# Patient Record
Sex: Female | Born: 1965 | Race: Black or African American | Hispanic: No | Marital: Single | Smoking: Never smoker
Health system: Southern US, Community
[De-identification: ages and names within clinical notes are randomized; demographics above are authoritative.]

## PROBLEM LIST (undated history)

## (undated) DIAGNOSIS — J45909 Unspecified asthma, uncomplicated: Secondary | ICD-10-CM

---

## 2016-06-10 ENCOUNTER — Encounter: Payer: Self-pay | Admitting: Obstetrics and Gynecology

## 2016-07-29 ENCOUNTER — Emergency Department (HOSPITAL_COMMUNITY): Payer: 59

## 2016-07-29 ENCOUNTER — Inpatient Hospital Stay (HOSPITAL_COMMUNITY)
Admit: 2016-07-29 | Discharge: 2016-07-29 | Disposition: A | Discharge status: Home / Self Care | Payer: 59 | Attending: Emergency Medicine | Admitting: Emergency Medicine

## 2016-07-29 ENCOUNTER — Emergency Department (HOSPITAL_COMMUNITY)
Admission: EM | Admit: 2016-07-29 | Discharge: 2016-07-29 | Disposition: A | Discharge status: Home / Self Care | Payer: 59 | Attending: Emergency Medicine | Admitting: Emergency Medicine

## 2016-07-29 ENCOUNTER — Encounter (HOSPITAL_COMMUNITY): Payer: Self-pay | Admitting: Emergency Medicine

## 2016-07-29 DIAGNOSIS — G932 Benign intracranial hypertension: Secondary | ICD-10-CM | POA: Insufficient documentation

## 2016-07-29 DIAGNOSIS — R51 Headache: Secondary | ICD-10-CM

## 2016-07-29 DIAGNOSIS — R519 Headache, unspecified: Secondary | ICD-10-CM

## 2016-07-29 DIAGNOSIS — R900 Intracranial space-occupying lesion found on diagnostic imaging of central nervous system: Secondary | ICD-10-CM

## 2016-07-29 DIAGNOSIS — R9089 Other abnormal findings on diagnostic imaging of central nervous system: Secondary | ICD-10-CM

## 2016-07-29 HISTORY — DX: Unspecified asthma, uncomplicated: J45.909

## 2016-07-29 LAB — I-STAT CHEM 8, ED
BUN: 11 mg/dL (ref 6–20)
CALCIUM ION: 1.17 mmol/L (ref 1.13–1.30)
CHLORIDE: 104 mmol/L (ref 101–111)
Creatinine, Ser: 0.9 mg/dL (ref 0.44–1.00)
Glucose, Bld: 105 mg/dL — ABNORMAL HIGH (ref 65–99)
HCT: 35 % — ABNORMAL LOW (ref 36.0–46.0)
Hemoglobin: 11.9 g/dL — ABNORMAL LOW (ref 12.0–15.0)
POTASSIUM: 4.2 mmol/L (ref 3.5–5.1)
SODIUM: 141 mmol/L (ref 135–145)
TCO2: 28 mmol/L (ref 0–100)

## 2016-07-29 LAB — I-STAT BETA HCG BLOOD, ED (MC, WL, AP ONLY): I-stat hCG, quantitative: <5 m[IU]/mL (ref <5)

## 2016-07-29 MED ORDER — HYDROCODONE-ACETAMINOPHEN 5-325 MG PO TABS
1.0000 | ORAL_TABLET | Freq: Once | ORAL | Status: AC
  Administered 2016-07-29: 1 via ORAL
  Filled 2016-07-29: qty 1

## 2016-07-29 MED ORDER — METOCLOPRAMIDE HCL 5 MG/ML IJ SOLN
10.0000 mg | Freq: Once | INTRAMUSCULAR | Status: AC
  Administered 2016-07-29: 10 mg via INTRAVENOUS
  Filled 2016-07-29: qty 2

## 2016-07-29 MED ORDER — SODIUM CHLORIDE 0.9 % IV BOLUS (SEPSIS)
1000.0000 mL | Freq: Once | INTRAVENOUS | Status: AC
  Administered 2016-07-29: 1000 mL via INTRAVENOUS

## 2016-07-29 MED ORDER — ACETAZOLAMIDE 250 MG PO TABS: 250.0000 mg | ORAL_TABLET | Freq: Three times a day (TID) | ORAL | 1 refills | Status: DC

## 2016-07-29 MED ORDER — DIPHENHYDRAMINE HCL 50 MG/ML IJ SOLN
25.0000 mg | Freq: Once | INTRAMUSCULAR | Status: AC
  Administered 2016-07-29: 25 mg via INTRAVENOUS
  Filled 2016-07-29: qty 1

## 2016-07-29 MED ORDER — HYDROCODONE-ACETAMINOPHEN 5-325 MG PO TABS: 2.0000 | ORAL_TABLET | ORAL | 0 refills | Status: DC | PRN

## 2016-07-29 MED ORDER — ACETAZOLAMIDE 250 MG PO TABS
250.0000 mg | ORAL_TABLET | Freq: Once | ORAL | Status: AC
  Administered 2016-07-29: 250 mg via ORAL
  Filled 2016-07-29: qty 1

## 2016-07-29 MED ORDER — ONDANSETRON 4 MG PO TBDP
4.0000 mg | ORAL_TABLET | Freq: Once | ORAL | Status: AC
  Administered 2016-07-29: 4 mg via ORAL
  Filled 2016-07-29: qty 1

## 2016-07-29 MED ORDER — GADOBENATE DIMEGLUMINE 529 MG/ML IV SOLN
20.0000 mL | Freq: Once | INTRAVENOUS | Status: AC
  Administered 2016-07-29: 20 mL via INTRAVENOUS

## 2016-07-29 MED ORDER — LORAZEPAM 2 MG/ML IJ SOLN
1.0000 mg | Freq: Once | INTRAMUSCULAR | Status: AC
  Administered 2016-07-29: 1 mg via INTRAVENOUS
  Filled 2016-07-29: qty 1

## 2016-07-29 NOTE — ED Notes (Signed)
Patient transported to CT 

## 2016-07-29 NOTE — ED Provider Notes (Signed)
Patient is a 50 year old female with a history of pseudotumor cerebri in the past to had been on Diamox but has not been on meds for some time presents with 3 weeks of persistent headache that's worsening and now causing dizziness upon standing. Patient was seen at [facility] and sent here for an MRI because of an abnormal CT. Patient received a headache cocktail with improvement in headache but is still not gone. Patient is not requesting anything at this time.   Gwyneth Sprout, MD 07/29/16 1409

## 2016-07-29 NOTE — ED Notes (Signed)
Re;port given to Paula/[facility]

## 2016-07-29 NOTE — ED Notes (Signed)
[facility] called for transportation 

## 2016-07-29 NOTE — ED Provider Notes (Signed)
[facility]-EMERGENCY DEPT Provider Note   CSN: [id number] Arrival date & time: 07/29/16  5974  First Provider Contact:  First MD Initiated Contact with Patient 07/29/16 0740        History   Chief Complaint Chief Complaint  Patient presents with  . Migraine  . Dizziness    HPI Emilyelizabeth Battistone is a 50 y.o. female.  HPI 50 year old African-American female with past medical history of obesity, sleep apnea, and previous history of pseudotumor who presents with a one-month history of progressively worsening daily headaches. The patient states for the last month, she has had progressively worsening, primarily frontal headaches. She describes a headache as aching, throbbing headaches. They are typically worse in the morning, but persisted throughout the day. She has taken over-the-counter antihistamines, analgesics, with no improvement. She has been wearing her CPAP. She states her pain and symptoms are very different from her sleep apnea headaches as well as her previous pseudotumor. Regarding her pseudotumor, she states that she was previously on Diamox but is off this is her symptoms had resolved. Denies any recent weight gain. Denies any vision changes. She had some mild, transient dizziness yesterday upon standing at work and subsequent decided to present for evaluation. Denies any vertigo symptoms. No fevers or chills.  Past Medical History:  Diagnosis Date  . Asthma     There are no active problems to display for this patient.   History reviewed. No pertinent surgical history.  OB History    No data available       Home Medications    Prior to Admission medications   Not on File    Family History No family history on file.  Social History Social History  Substance Use Topics  . Smoking status: Never Smoker  . Smokeless tobacco: Never Used  . Alcohol use No     Allergies   Review of patient's allergies indicates not on file.   Review of Systems Review of  Systems  Constitutional: Negative for chills, fatigue and fever.  HENT: Negative for congestion and rhinorrhea.   Eyes: Negative for visual disturbance.  Respiratory: Negative for cough, shortness of breath and wheezing.   Cardiovascular: Negative for chest pain and leg swelling.  Gastrointestinal: Negative for abdominal pain, diarrhea, nausea and vomiting.  Genitourinary: Negative for dysuria and flank pain.  Musculoskeletal: Negative for neck pain and neck stiffness.  Skin: Negative for rash and wound.  Allergic/Immunologic: Negative for immunocompromised state.  Neurological: Positive for light-headedness and headaches. Negative for syncope and weakness.     Physical Exam Updated Vital Signs BP (!) 157/108 (BP Location: Left Arm)   Pulse 72   Temp 98.5 F (36.9 C) (Oral)   Resp 20   SpO2 100%   Physical Exam  Constitutional: She is oriented to person, place, and time. She appears well-developed and well-nourished. No distress.  HENT:  Head: Normocephalic and atraumatic.  Mouth/Throat: Oropharynx is clear and moist.  No sinus tenderness to palpation or percussion.  Eyes: Conjunctivae are normal. Pupils are equal, round, and reactive to light.  Neck: Neck supple.  No neck stiffness or meningismus.  Cardiovascular: Normal rate, regular rhythm and normal heart sounds.  Exam reveals no friction rub.   No murmur heard. Pulmonary/Chest: Effort normal and breath sounds normal. No respiratory distress. She has no wheezes. She has no rales.  Abdominal: She exhibits no distension. There is no tenderness.  Musculoskeletal: She exhibits no edema.  Neurological: She is alert and oriented to person, place, and  time. She exhibits normal muscle tone.  Skin: Skin is warm. Capillary refill takes less than 2 seconds.  Nursing note and vitals reviewed.  Neurological Exam:  Mental Status: Alert and oriented to person, place, and time. Attention and concentration normal. Speech clear. Recent  memory is intact. Cranial Nerves: Visual fields intact to confrontation in all quadrants bilaterally. EOMI and PERRLA. No nystagmus noted. Facial sensation intact at forehead, maxillary cheek, and chin/mandible bilaterally. No weakness of masticatory muscles. No facial asymmetry or weakness. Hearing grossly normal to finer rub. Uvula is midline, and palate elevates symmetrically. Normal SCM and trapezius strength. Tongue midline without fasciculations Motor: Muscle strength 5/5 in proximal and distal UE and LE bilaterally. No pronator drift. Muscle tone normal. Reflexes: 2+ and symmetrical in all four extremities.  Sensation: Intact to light touch in upper and lower extremities distally bilaterally.  Gait: Normal without ataxia. Coordination: Normal FTN bilaterally.   ED Treatments / Results  Labs (all labs ordered are listed, but only abnormal results are displayed) Labs Reviewed  I-STAT BETA HCG BLOOD, ED ([facility], [facility], [facility] ONLY)    EKG  EKG Interpretation None       Radiology No results found.  Procedures Procedures (including critical care time)  Medications Ordered in ED Medications  sodium chloride 0.9 % bolus 1,000 mL (not administered)  metoCLOPramide (REGLAN) injection 10 mg (not administered)  diphenhydrAMINE (BENADRYL) injection 25 mg (not administered)     Initial Impression / Assessment and Plan / ED Course  I have reviewed the triage vital signs and the nursing notes.  Pertinent labs & imaging results that were available during my care of the patient were reviewed by me and considered in my medical decision making (see chart for details).  Clinical Course  Value Comment By Time   50 year old African-American female with past medical history of hypertension, obesity, sleep apnea, and migraines as well as pseudotumor who presents with a one-month history of progressively worsening, dull, frontal headaches. She states her headaches are slightly different in character  from her previous headaches. Denies any recent head trauma. No focal numbness or weakness. She is neurologically intact on exam. She is mildly hypertensive but otherwise well-appearing. Of note, she has been taking over-the-counter analgesics regularly. Primary suspicion at this time is primary headache syndrome, likely tension type versus analgesic rebound. Migraine is also in the differential. Worsening sleep apnea is considered as well although she has been wearing her CPAP. No fever, neck stiffness, photophobia, or signs of meningismus or encephalitis. Of note, headache does seem to worsen in the morning. Given different quality and worsening in the mornings, with persistence despite outpatient management, will obtain CT scan to evaluate for intracranial mass. No focal neuro deficits on exam. Otherwise, will treat symptomatically and reassess. Shaune Pollack, MD 08/02 [phone]   CT head completed and reviewed by myself. There is an approximately 2 x 2 x 2 cm fatty mass over the sella. Patient denies any known history of dermoid cyst. Per radiology recommendation, will obtain MRI for further assessment. Shaune Pollack, MD 08/02 [phone]   Ativan ordered for claustrophobia with MRI. Shaune Pollack, MD 08/02 [phone]  MR ATTEMPTED STUDY-NO REPORT (Reviewed) Shaune Pollack, MD 08/02 1123   Unfortunately, patient unable to obtain MRI due to body size. Per discussion with radiology, [facility]. I discussed this with the patient and family in detail. Given her persistent headache and findings on CT, will transfer for MRI with and without contrast.  Sign out given to Dr. Lynelle Doctor, MD at [facility]. Patient reexamined and stable prior to transfer. Shaune Pollack, MD 08/02 1220     Final Clinical Impressions(s) / ED Diagnoses   Final diagnoses:  Headache  Acute nonintractable headache, unspecified headache type  Intracranial space-occupying lesion    New Prescriptions New  Prescriptions   No medications on file     Shaune Pollack, MD 07/29/16 1245

## 2016-07-29 NOTE — ED Notes (Signed)
Patient transported to MRI 

## 2016-07-29 NOTE — ED Triage Notes (Signed)
Patient here with complaints of headaches for a couple of weeks, and dizziness that started yesterday. Tylenol, Excedrin, allergy medication without relief.

## 2016-07-29 NOTE — ED Notes (Signed)
Placed patient in a gown and on the monitor 

## 2016-07-29 NOTE — ED Notes (Signed)
Explained transfer process to patient and family-patient is in agreement to be tranferred via [facility]

## 2016-07-29 NOTE — ED Provider Notes (Signed)
Patient seen and evaluated. Chart reviewed. Patient's care assumed. MRI findings discussed with patient. Findings show M.D. cella consistent with patient's past history of pseudotumor. Symptoms are fairly well-controlled at this time. We'll give neurology for follow-up. Prescriptions for Diamox. Vicodin for pain.   Rolland Porter, MD 07/29/16 [phone]

## 2016-07-29 NOTE — ED Notes (Signed)
EDP notified that the patient was too large to fit the MRI-MRI not completed.

## 2016-08-19 ENCOUNTER — Ambulatory Visit (INDEPENDENT_AMBULATORY_CARE_PROVIDER_SITE_OTHER): Payer: 59 | Admitting: Neurology

## 2016-08-19 ENCOUNTER — Encounter: Payer: Self-pay | Admitting: Neurology

## 2016-08-19 VITALS — BP 130/76 | HR 78 | Ht 66.0 in | Wt 298.0 lb

## 2016-08-19 DIAGNOSIS — G932 Benign intracranial hypertension: Secondary | ICD-10-CM | POA: Diagnosis not present

## 2016-08-19 MED ORDER — ACETAZOLAMIDE 250 MG PO TABS: 500.0000 mg | ORAL_TABLET | Freq: Two times a day (BID) | ORAL | 3 refills | Status: AC

## 2016-08-19 NOTE — Patient Instructions (Signed)
1.  I am going to increase the acetazolamide (Diamox) to 500mg  twice daily. 2.  Follow up with the ophthalmologist and follow up with me in 2 months 3.  Try to work on losing weight, which can help resolve this.

## 2016-08-19 NOTE — Progress Notes (Signed)
NEUROLOGY CONSULTATION NOTE  Sonya Wyatt MRN: [id number] DOB: [birth year]  Referring provider: ED referral Primary care provider: no PCP  Reason for consult:  headache  HISTORY OF PRESENT ILLNESS: Sonya Wyatt is a 50 year old left-handed woman with hypertension, morbid obesity, OSA, migraines and history of pseudotumor cerebri who presents for headache.  She was diagnosed with idiopathic intracranial hypertension in 2006, after developing headache and unsteady gait.  She was diagnosed via lumbar puncture and was on Diamox for a while.  It was eventually discontinued by her neurologist at the time.  Headaches were controlled until recently.  She developed the same type of headache.  It is bi-frontal/temporal and top of her head, 5-10/10 and non-throbbing.  It is not positional.  She reports blurred vision but denies visual obscurations.  She does report a pulsating sound in her head.  She presented to the ED on 07/29/16 for further evaluation.  CT head was personally reviewed and revealed a 1.9 x 2.3 x 2.3 cm fat-density mass over the sella.  MRI of brain with and without contrast was personally reviewed and revaled markedly enlarged empty sessa with dilated optic nerve sheathes, compatible with history of pseudotumor cerebri.  She was started on Diamox 250mg  three times daily, which helped the headaches but they have since returned over the past week.    Labs from 07/29/16 showed Na 141, K 4.2, Cl 104, BUN 11 and Cr 0.90.  PAST MEDICAL HISTORY: Past Medical History:  Diagnosis Date  . Asthma     PAST SURGICAL HISTORY: No past surgical history on file.  MEDICATIONS: Current Outpatient Prescriptions on File Prior to Visit  Medication Sig Dispense Refill  . albuterol (PROVENTIL HFA;VENTOLIN HFA) 108 (90 Base) MCG/ACT inhaler Inhale 2 puffs into the lungs every 6 (six) hours as needed for wheezing or shortness of breath.     No current facility-administered medications on file  prior to visit.     ALLERGIES: Allergies  Allergen Reactions  . Morphine And Related Shortness Of Breath and Other (See Comments)    Chest closes up    FAMILY HISTORY: No family history on file.  SOCIAL HISTORY: Social History   Social History  . Marital status: Single    Spouse name: N/A  . Number of children: N/A  . Years of education: N/A   Occupational History  . Not on file.   Social History Main Topics  . Smoking status: Never Smoker  . Smokeless tobacco: Never Used  . Alcohol use No  . Drug use: Unknown  . Sexual activity: Not on file   Other Topics Concern  . Not on file   Social History Narrative  . No narrative on file    REVIEW OF SYSTEMS: Constitutional: No fevers, chills, or sweats, no generalized fatigue, change in appetite Eyes: No visual changes, double vision, eye pain Ear, nose and throat: No hearing loss, ear pain, nasal congestion, sore throat Cardiovascular: No chest pain, palpitations Respiratory:  No shortness of breath at rest or with exertion, wheezes GastrointestinaI: No nausea, vomiting, diarrhea, abdominal pain, fecal incontinence Genitourinary:  No dysuria, urinary retention or frequency Musculoskeletal:  No neck pain, Wyatt pain Integumentary: No rash, pruritus, skin lesions Neurological: as above Psychiatric: No depression, insomnia, anxiety Endocrine: No palpitations, fatigue, diaphoresis, mood swings, change in appetite, change in weight, increased thirst Hematologic/Lymphatic:  No purpura, petechiae. Allergic/Immunologic: no itchy/runny eyes, nasal congestion, recent allergic reactions, rashes  PHYSICAL EXAM: Vitals:   08/19/16 1449  BP: 130/76  Pulse: 78   General: No acute distress.  Patient appears well-groomed.  Head:  Normocephalic/atraumatic Eyes:  fundi examined but not visualized Neck: supple, no paraspinal tenderness, full range of motion Wyatt: No paraspinal tenderness Heart: regular rate and rhythm Lungs:  Clear to auscultation bilaterally. Vascular: No carotid bruits. Neurological Exam: Mental status: alert and oriented to person, place, and time, recent and remote memory intact, fund of knowledge intact, attention and concentration intact, speech fluent and not dysarthric, language intact. Cranial nerves: CN I: not tested CN II: pupils equal, round and reactive to light, visual fields intact CN III, IV, VI:  full range of motion, no nystagmus, no ptosis CN V: facial sensation intact CN VII: upper and lower face symmetric CN VIII: hearing intact CN IX, X: gag intact, uvula midline CN XI: sternocleidomastoid and trapezius muscles intact CN XII: tongue midline Bulk & Tone: normal, no fasciculations. Motor:  5/5 throughout  Sensation: temperature and vibration sensation intact. Deep Tendon Reflexes:  2+ throughout, toes downgoing.  Finger to nose testing:  Without dysmetria.  Heel to shin:  Without dysmetria.  Gait:  Normal station and stride.  Able to turn and tandem walk. Romberg negative.  IMPRESSION: Headache, probable idiopathic intracranial hypertension  PLAN: 1.  Increase Diamox to 500mg  twice daily 2.  Refer to ophthalmology for formal eye exam 3.  Discussed weight loss 4.  Follow up in 2 months.   Shon MilletAdam Micholas Drumwright, DO

## 2016-08-20 NOTE — Addendum Note (Signed)
Addended by: Sheilah MinsFOX, JADA A on: 08/20/2016 08:18 AM   Modules accepted: Orders

## 2016-10-21 ENCOUNTER — Ambulatory Visit: Payer: 59 | Admitting: Neurology

## 2017-01-08 ENCOUNTER — Other Ambulatory Visit: Payer: Self-pay | Admitting: Family Medicine

## 2017-01-08 DIAGNOSIS — Z1231 Encounter for screening mammogram for malignant neoplasm of breast: Secondary | ICD-10-CM

## 2017-03-12 ENCOUNTER — Ambulatory Visit: Payer: 59

## 2017-09-27 IMAGING — CT CT HEAD W/O CM
3 of 4 series · 14 of 47 positions shown, 16 images · non-contrast
Comparison: None.

CLINICAL DATA: Headache for a couple of weeks with dizziness
beginning yesterday.

EXAM:
CT HEAD WITHOUT CONTRAST
TECHNIQUE: Contiguous axial images were obtained from the base of the skull
through the vertex without intravenous contrast.

[Series 2: head w/o · axial · non-contrast · 0.45mm/px · z∈[-166,-46]mm · 8 of 29 slices shown, 10 images]
[im 3/29  brain]
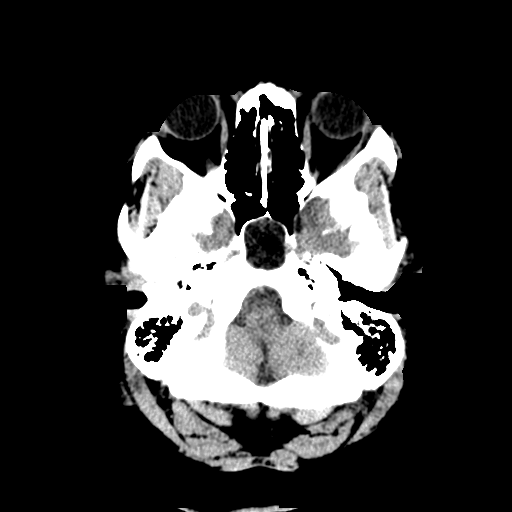
[im 3/29  bone]
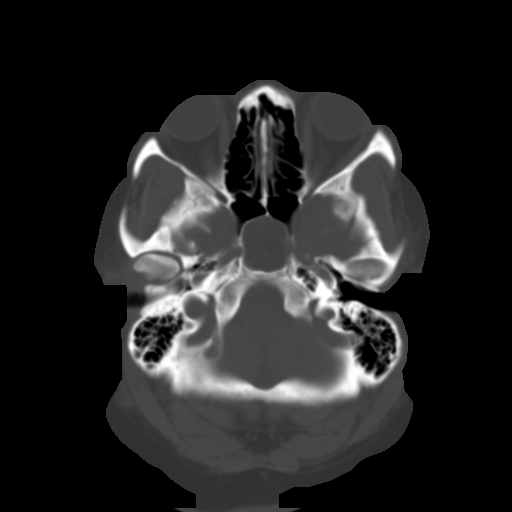
[im 7/29  brain]
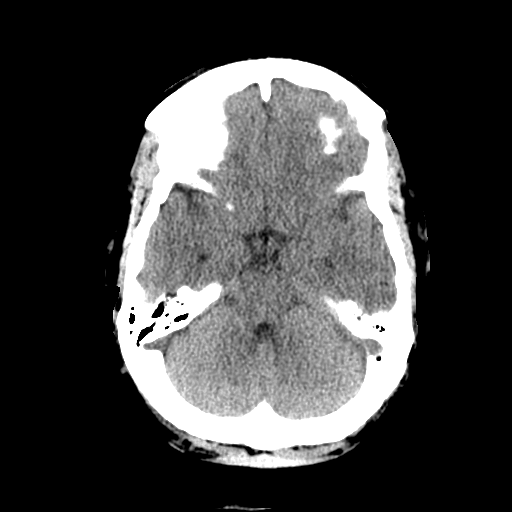
[im 11/29  brain]
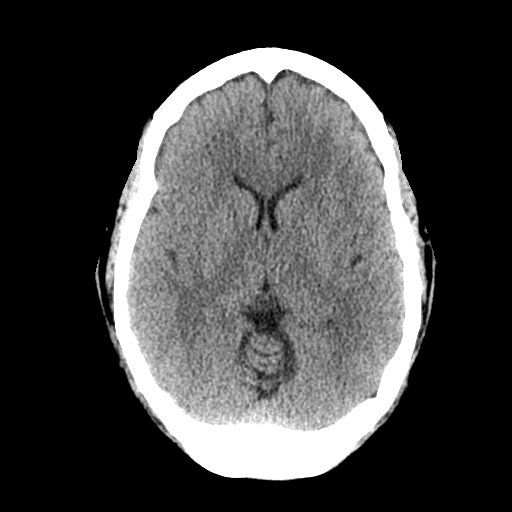
[im 13/29  brain]
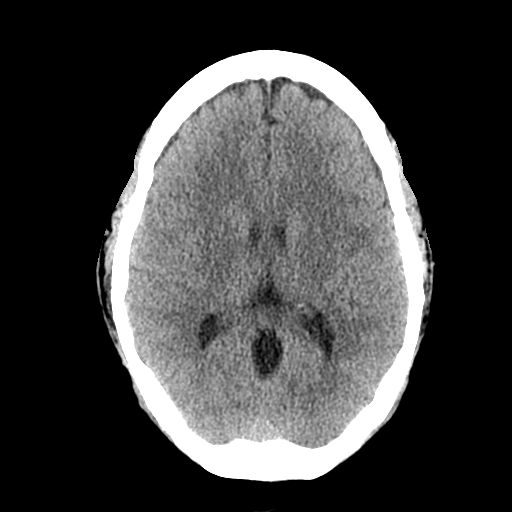
[im 17/29  brain]
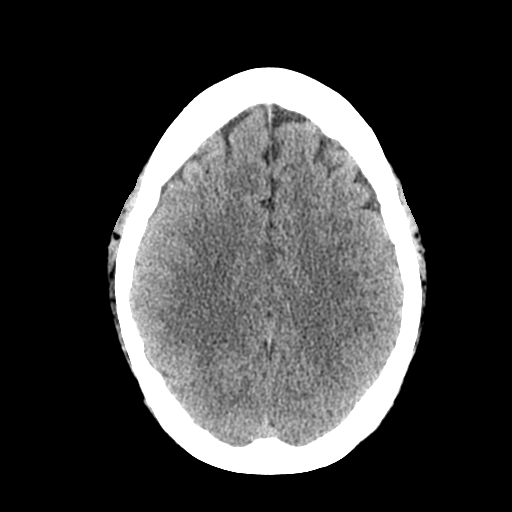
[im 17/29  bone]
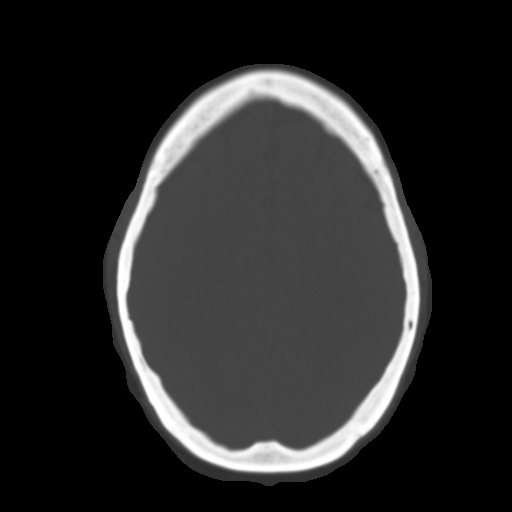
[im 19/29  brain]
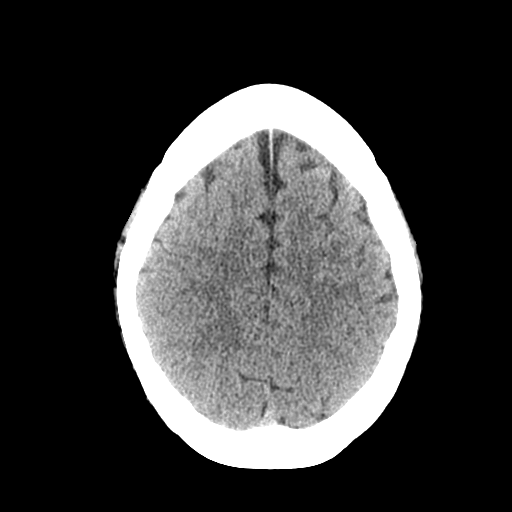
[im 23/29  brain]
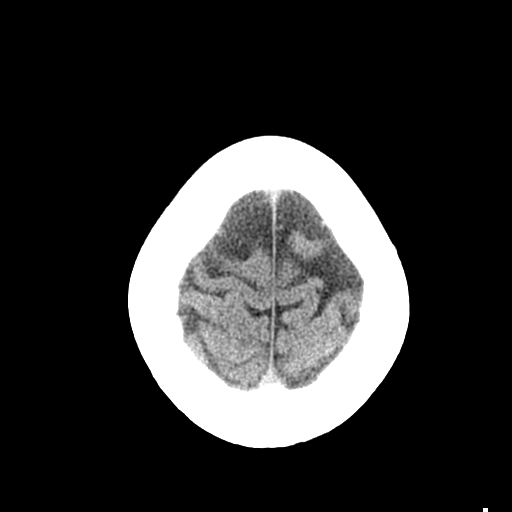
[im 27/29  brain]
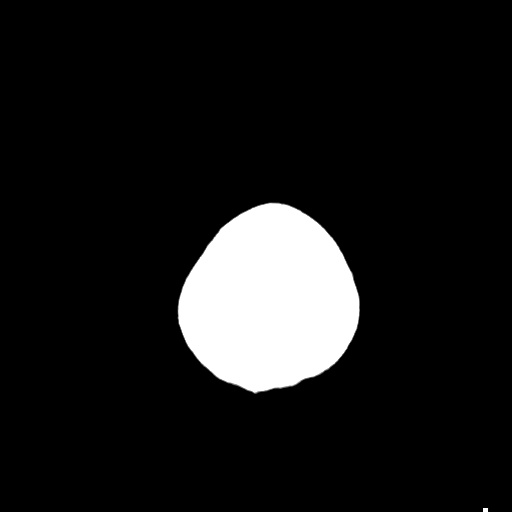

[Series 5: coronal · coronal · 0.28mm/px · 3 of 71 slices shown]
[im 24/71  brain]
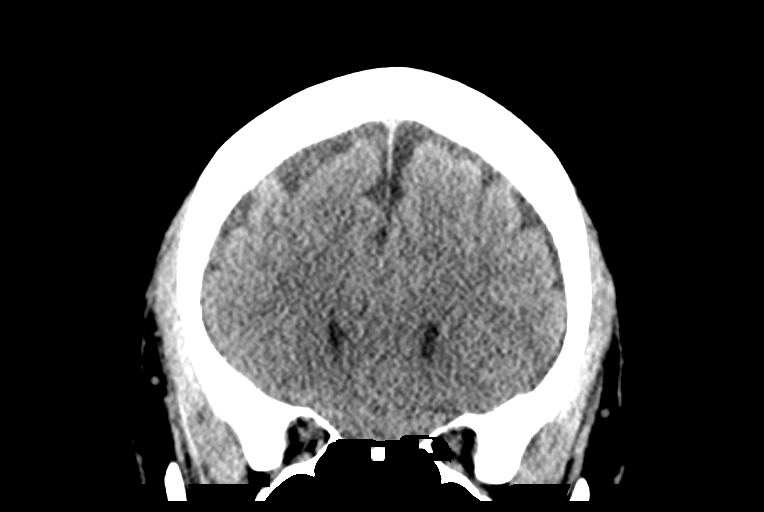
[im 32/71  brain]
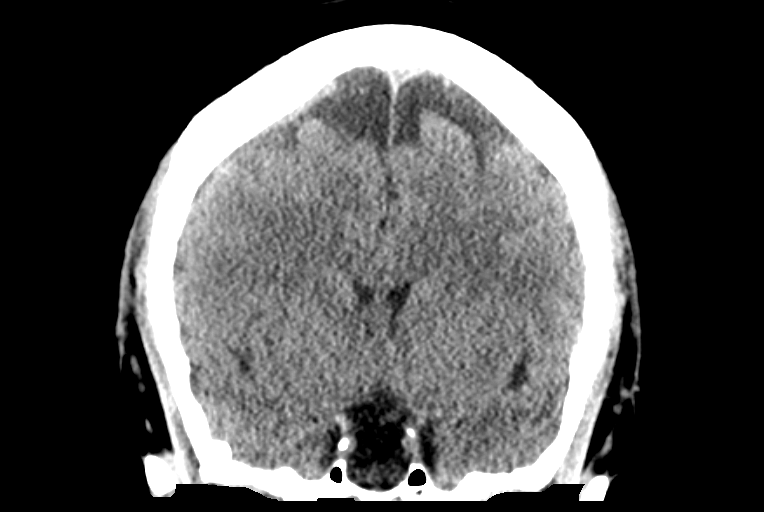
[im 39/71  brain]
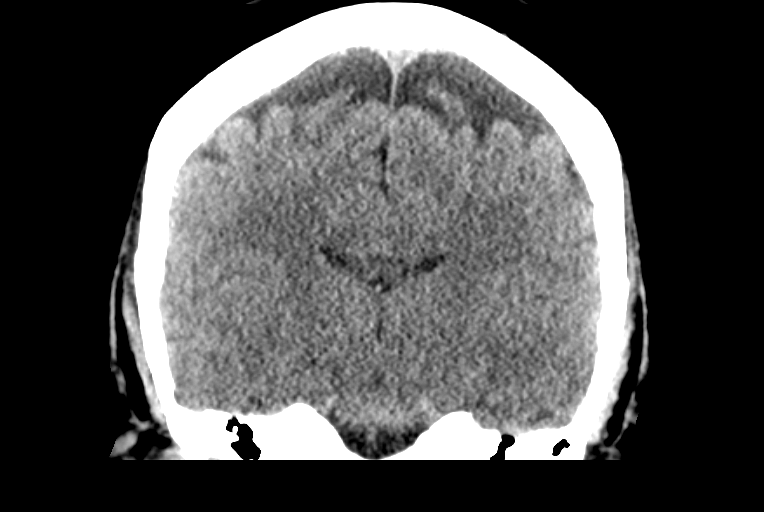

[Series 6: sagittal · sagittal · 0.28mm/px · 3 of 57 slices shown]
[im 19/57  brain]
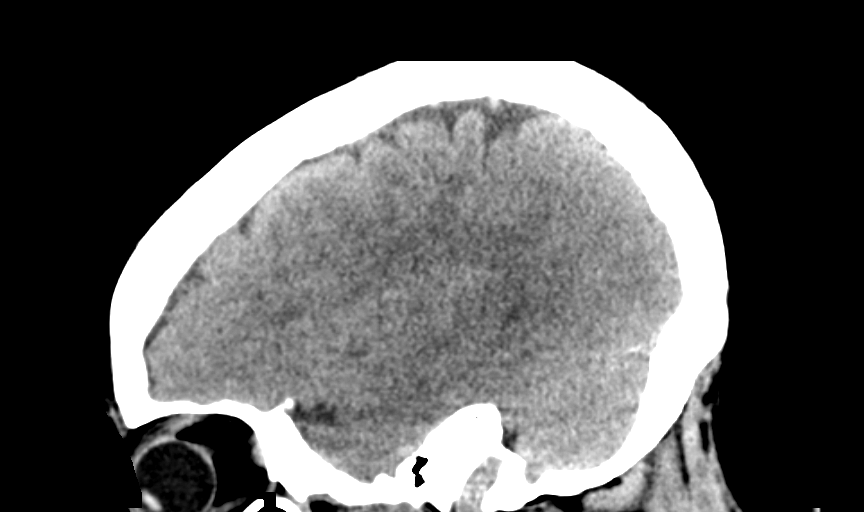
[im 29/57  brain]
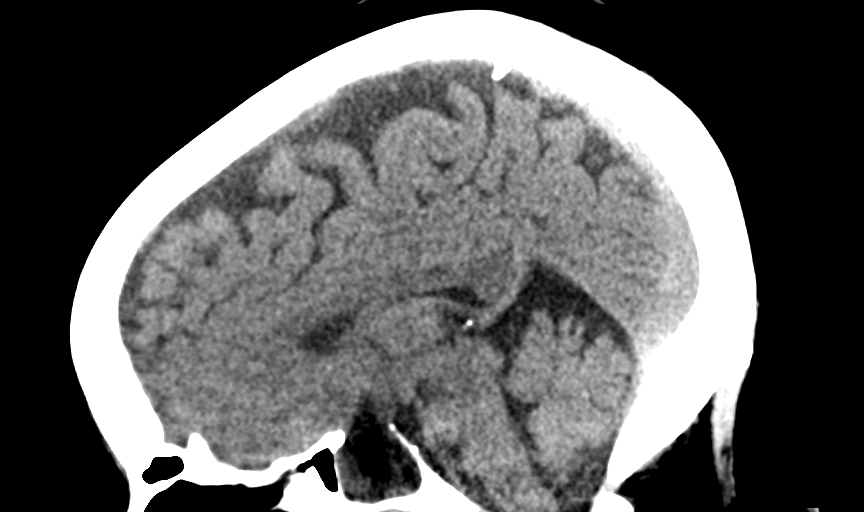
[im 38/57  brain]
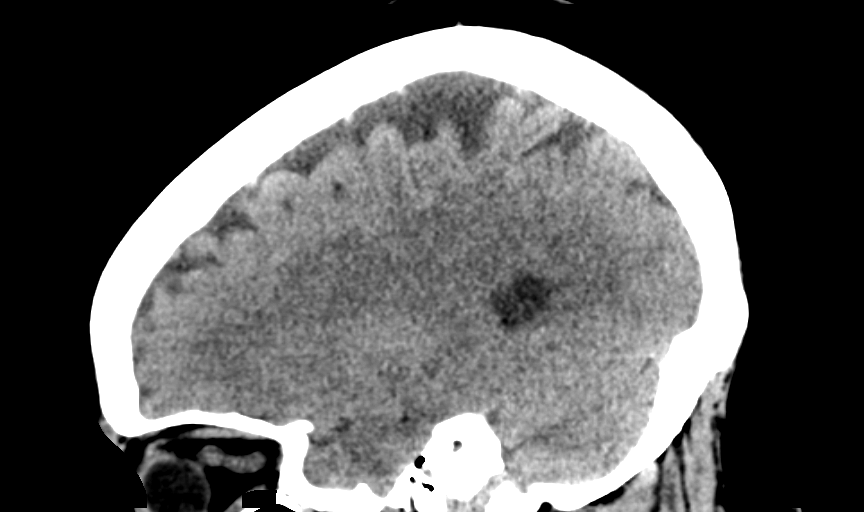

[14 of 47 positions shown; findings below may reference images not displayed]

FINDINGS: The ventricles, cisterns and other CSF spaces are within normal.
There is no mass, mass effect, shift midline structures or acute
hemorrhage. No evidence of acute infarction. There is a predominant
fat density mass with slight heterogeneity over the sella measuring
1.9 x 2.3 x 2.3 cm likely a dermoid and far less likely other
neoplasm such as fat containing craniopharyngioma. Remaining bony
structures and soft tissues are within normal.
IMPRESSION: No acute intracranial findings.

Predominant fat-density mass with mild heterogeneity over the sella
measuring 1.9 x 2.3 x 2.3 cm likely a dermoid cyst. Recommend MRI
pre and post contrast for further evaluation.

## 2017-11-20 ENCOUNTER — Other Ambulatory Visit: Payer: Self-pay | Admitting: Neurology

## 2019-06-18 ENCOUNTER — Other Ambulatory Visit: Payer: Self-pay | Admitting: *Deleted

## 2019-06-18 DIAGNOSIS — Z20822 Contact with and (suspected) exposure to covid-19: Secondary | ICD-10-CM

## 2019-06-22 NOTE — Addendum Note (Signed)
Addended by: Brigitte Pulse on: 06/22/2019 03:22 PM   Modules accepted: Orders
